# Patient Record
Sex: Male | Born: 2000 | Race: Black or African American | Hispanic: No | Marital: Single | Smoking: Current every day smoker
Health system: Southern US, Community
[De-identification: ages and names within clinical notes are randomized; demographics above are authoritative.]

## PROBLEM LIST (undated history)

## (undated) DIAGNOSIS — Z9289 Personal history of other medical treatment: Secondary | ICD-10-CM

## (undated) DIAGNOSIS — L0591 Pilonidal cyst without abscess: Secondary | ICD-10-CM

## (undated) HISTORY — DX: Pilonidal cyst without abscess: L05.91

---
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
## 2019-03-13 IMAGING — CR DG ABDOMEN 1V
1 series · 1 of 1 positions shown · non-contrast
Comparison: None.

CLINICAL DATA: 15-year-old male with generalized abdominal pain.

EXAM:
ABDOMEN - 1 VIEW

[dg abd 1 view]
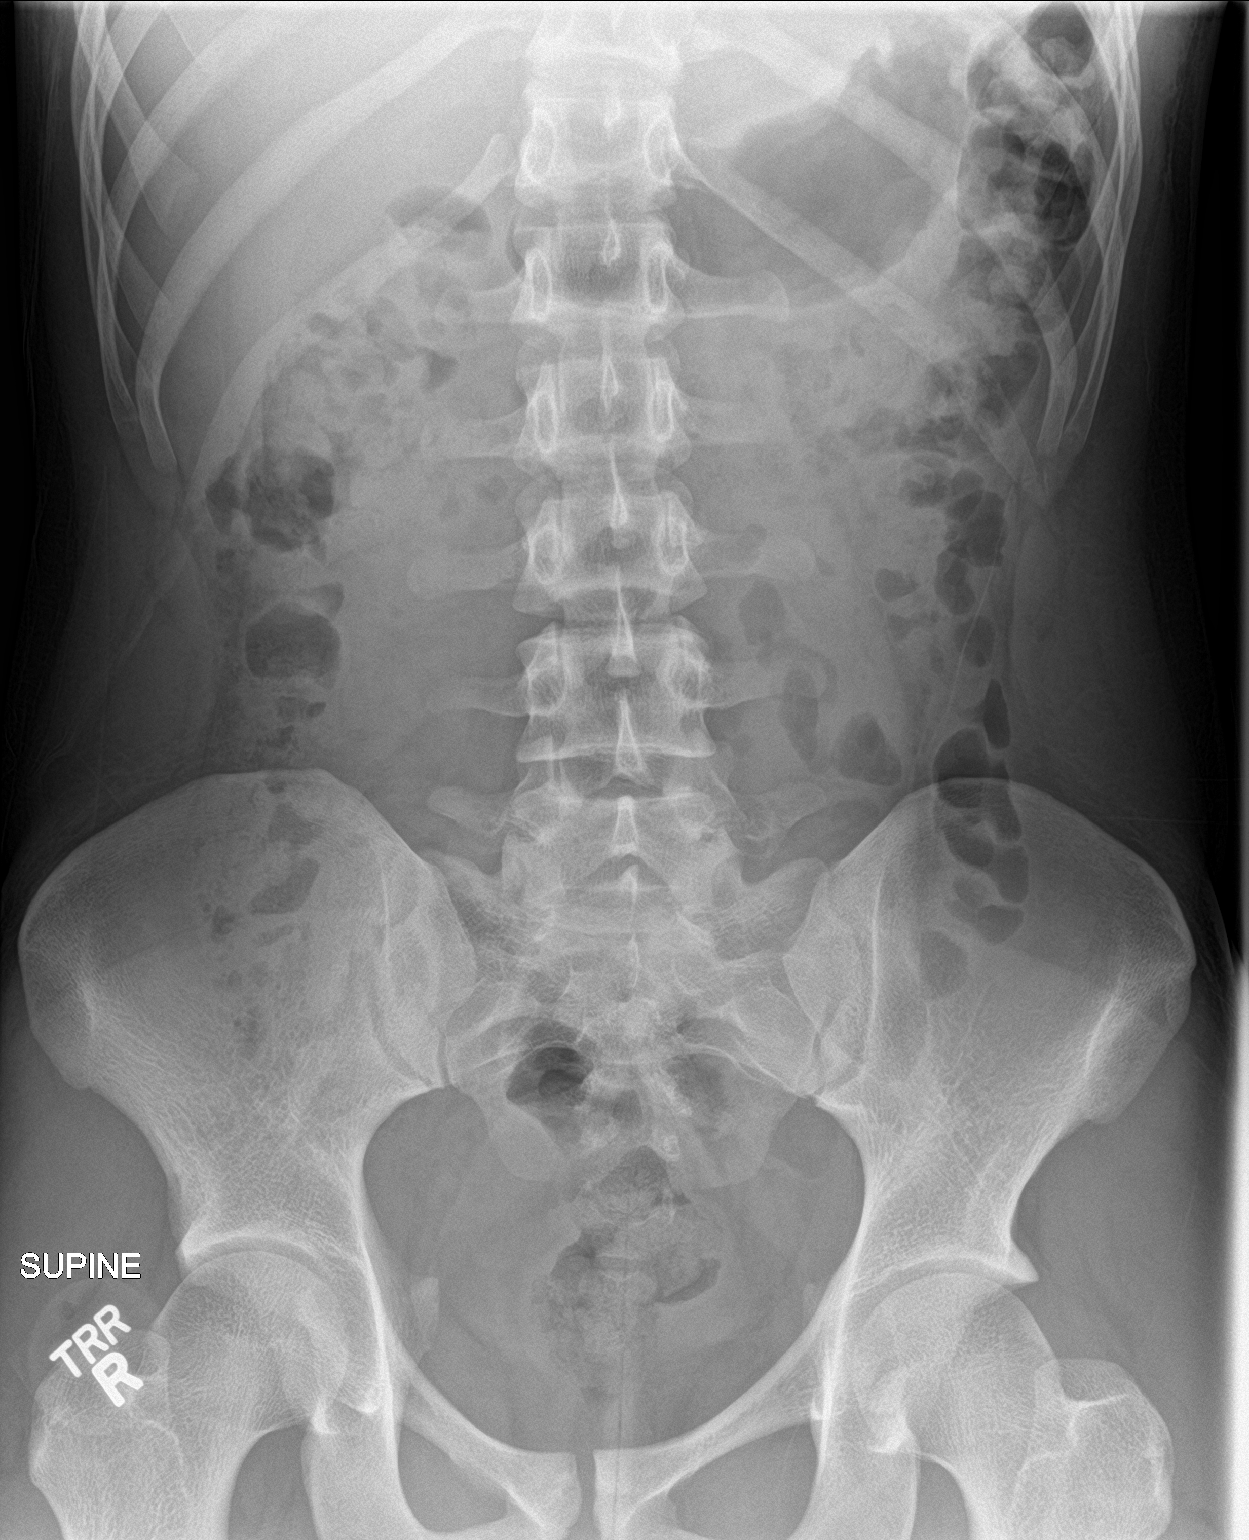

[1 of 1 positions shown; findings below may reference images not displayed]

FINDINGS: The bowel gas pattern is normal. No radio-opaque calculi or other
significant radiographic abnormality are seen.
IMPRESSION: Negative.

## 2019-08-27 IMAGING — DX DG KNEE COMPLETE 4+V*R*
4 series · 4 of 4 positions shown · non-contrast
Comparison: December 21, 2013

CLINICAL DATA: Anterior right knee pain for 3 days.

EXAM:
RIGHT KNEE - COMPLETE 4+ VIEW

[knee ap (1 of 3)]
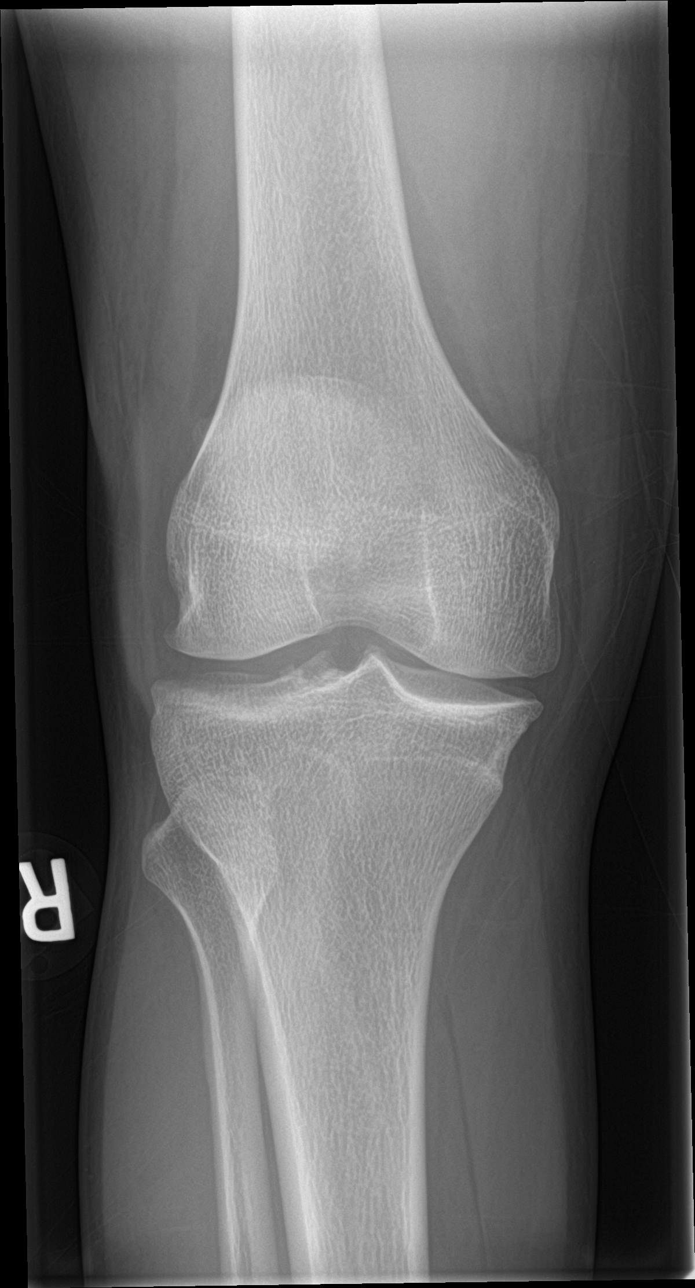

[knee lat]
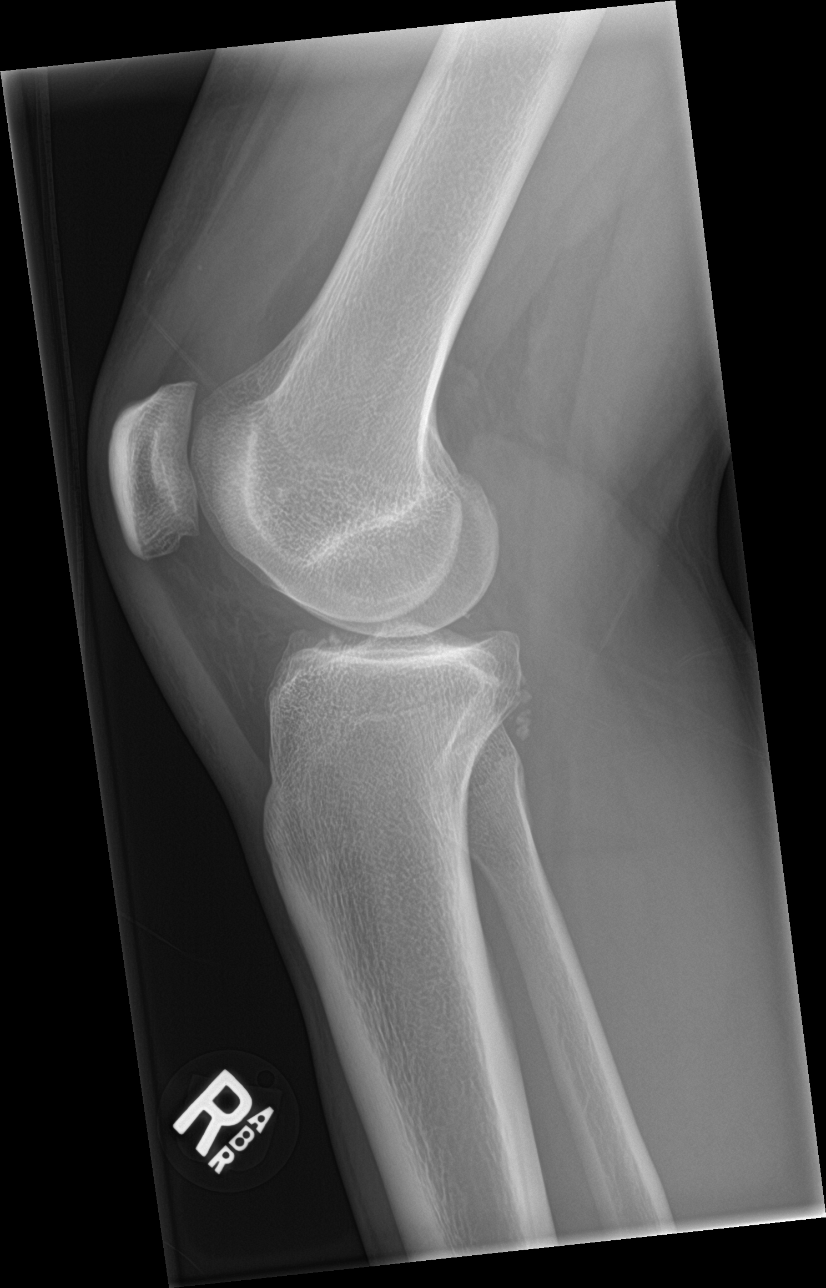

[knee ap (2 of 3)]
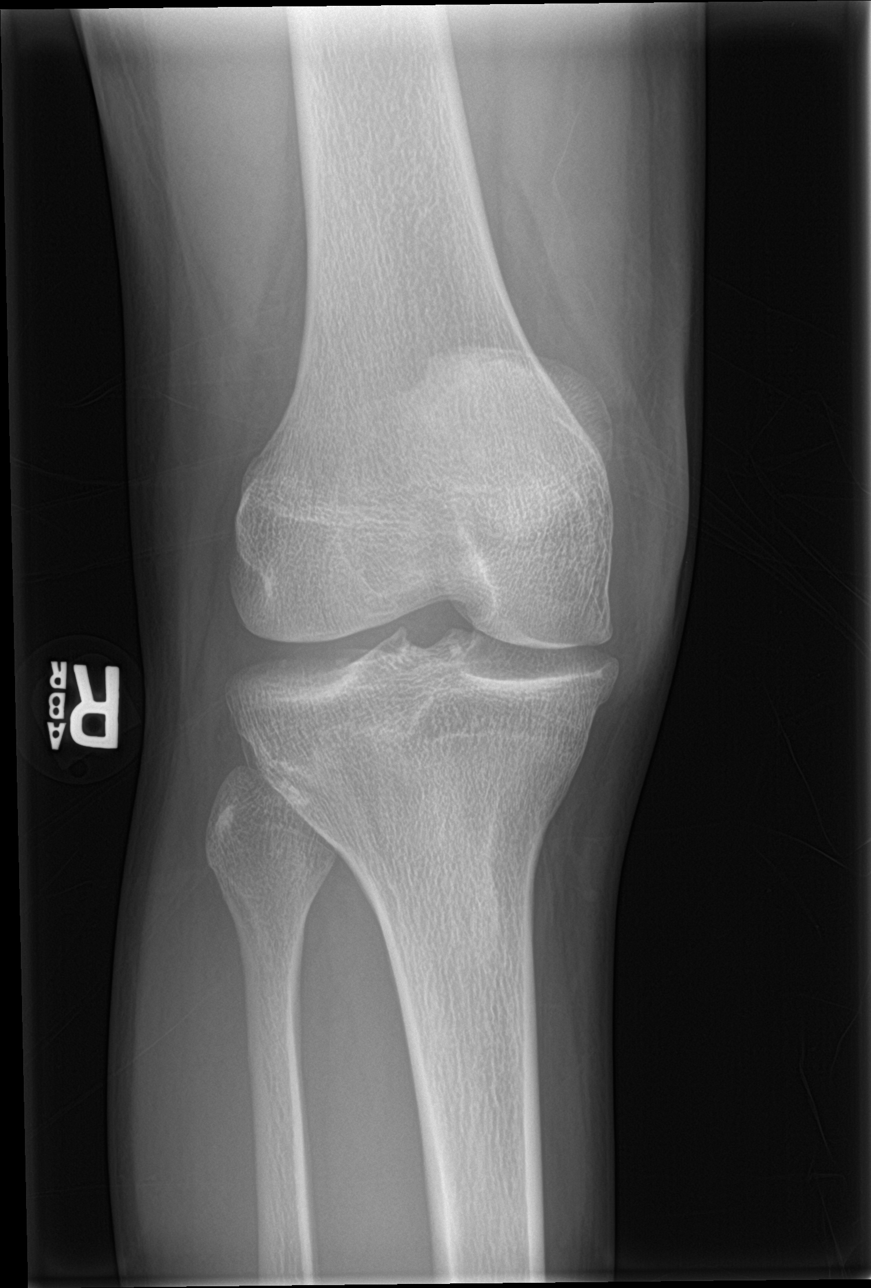

[knee ap (3 of 3)]
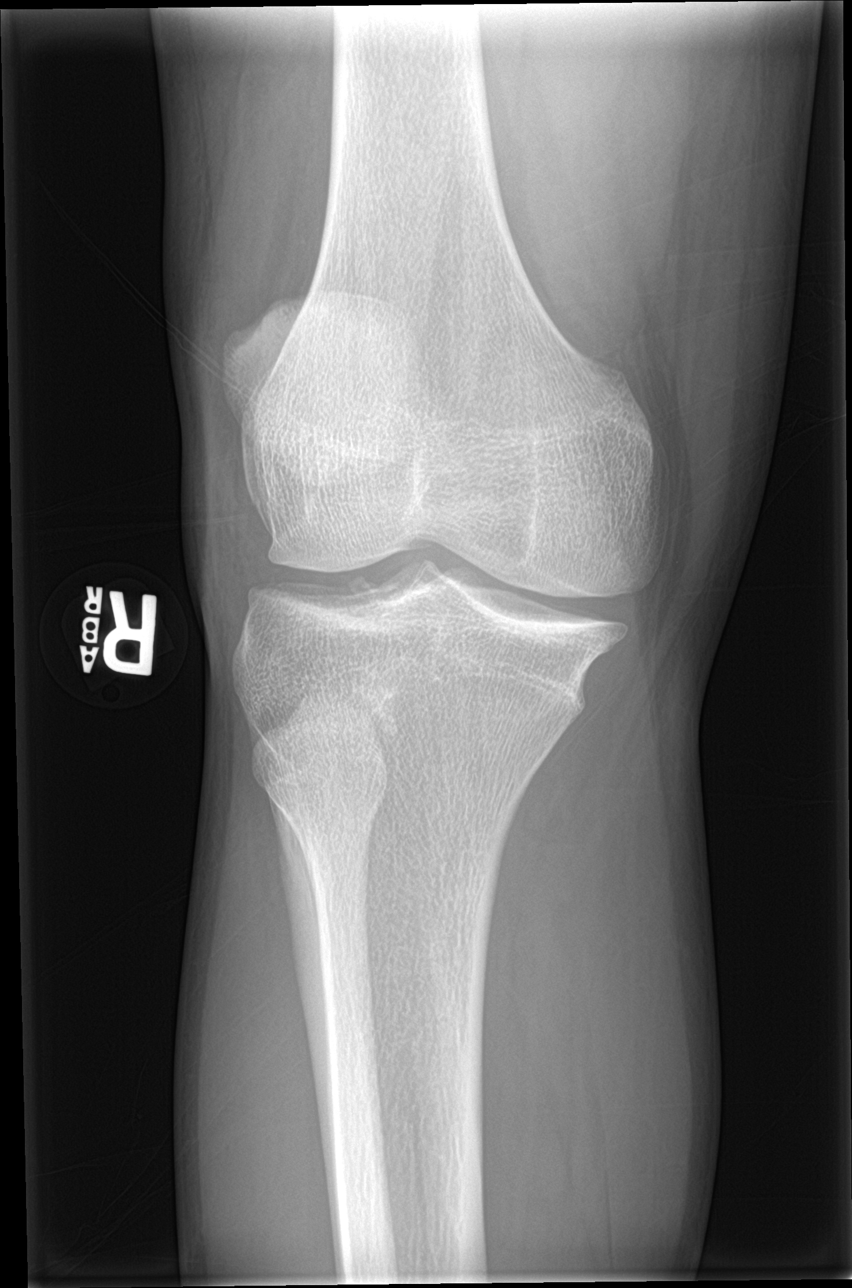

[4 of 4 positions shown; findings below may reference images not displayed]

FINDINGS: No evidence of fracture, dislocation, or joint effusion.
Degenerative joint changes of the right knee are noted. Soft tissues
are unremarkable.
IMPRESSION: No acute abnormality.  Degenerative joint changes of right knee.

## 2019-10-19 ENCOUNTER — Emergency Department (HOSPITAL_COMMUNITY): Payer: No Typology Code available for payment source

## 2019-10-19 ENCOUNTER — Emergency Department (HOSPITAL_COMMUNITY)
Admission: EM | Admit: 2019-10-19 | Discharge: 2019-10-19 | Disposition: A | Discharge status: Home / Self Care | Payer: No Typology Code available for payment source | Attending: Emergency Medicine | Admitting: Emergency Medicine

## 2019-10-19 ENCOUNTER — Other Ambulatory Visit: Payer: Self-pay

## 2019-10-19 ENCOUNTER — Encounter (HOSPITAL_COMMUNITY): Payer: Self-pay | Admitting: Emergency Medicine

## 2019-10-19 DIAGNOSIS — F17290 Nicotine dependence, other tobacco product, uncomplicated: Secondary | ICD-10-CM | POA: Insufficient documentation

## 2019-10-19 DIAGNOSIS — Y999 Unspecified external cause status: Secondary | ICD-10-CM | POA: Diagnosis not present

## 2019-10-19 DIAGNOSIS — Y92410 Unspecified street and highway as the place of occurrence of the external cause: Secondary | ICD-10-CM | POA: Diagnosis not present

## 2019-10-19 DIAGNOSIS — S199XXA Unspecified injury of neck, initial encounter: Secondary | ICD-10-CM | POA: Diagnosis present

## 2019-10-19 DIAGNOSIS — Y939 Activity, unspecified: Secondary | ICD-10-CM | POA: Diagnosis not present

## 2019-10-19 DIAGNOSIS — S161XXA Strain of muscle, fascia and tendon at neck level, initial encounter: Secondary | ICD-10-CM | POA: Diagnosis not present

## 2019-10-19 MED ORDER — METHOCARBAMOL 500 MG PO TABS: 500.0000 mg | ORAL_TABLET | Freq: Two times a day (BID) | ORAL | 0 refills | Status: AC

## 2019-10-19 MED ORDER — METHOCARBAMOL 500 MG PO TABS: 500.0000 mg | ORAL_TABLET | Freq: Two times a day (BID) | ORAL | 0 refills | Status: DC

## 2019-10-19 NOTE — ED Notes (Signed)
Discharge instructions discussed with pt. Pt verbalized understanding with no question at this time. Pt to go  home with mother.
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
## 2019-11-21 IMAGING — DX DG CHEST 2V
2 series · 2 of 2 positions shown · non-contrast
Comparison: Chest x-ray dated June 24, 2017.

CLINICAL DATA: Left-sided chest pain for the past few months.

EXAM:
CHEST  2 VIEW

[chest pa]
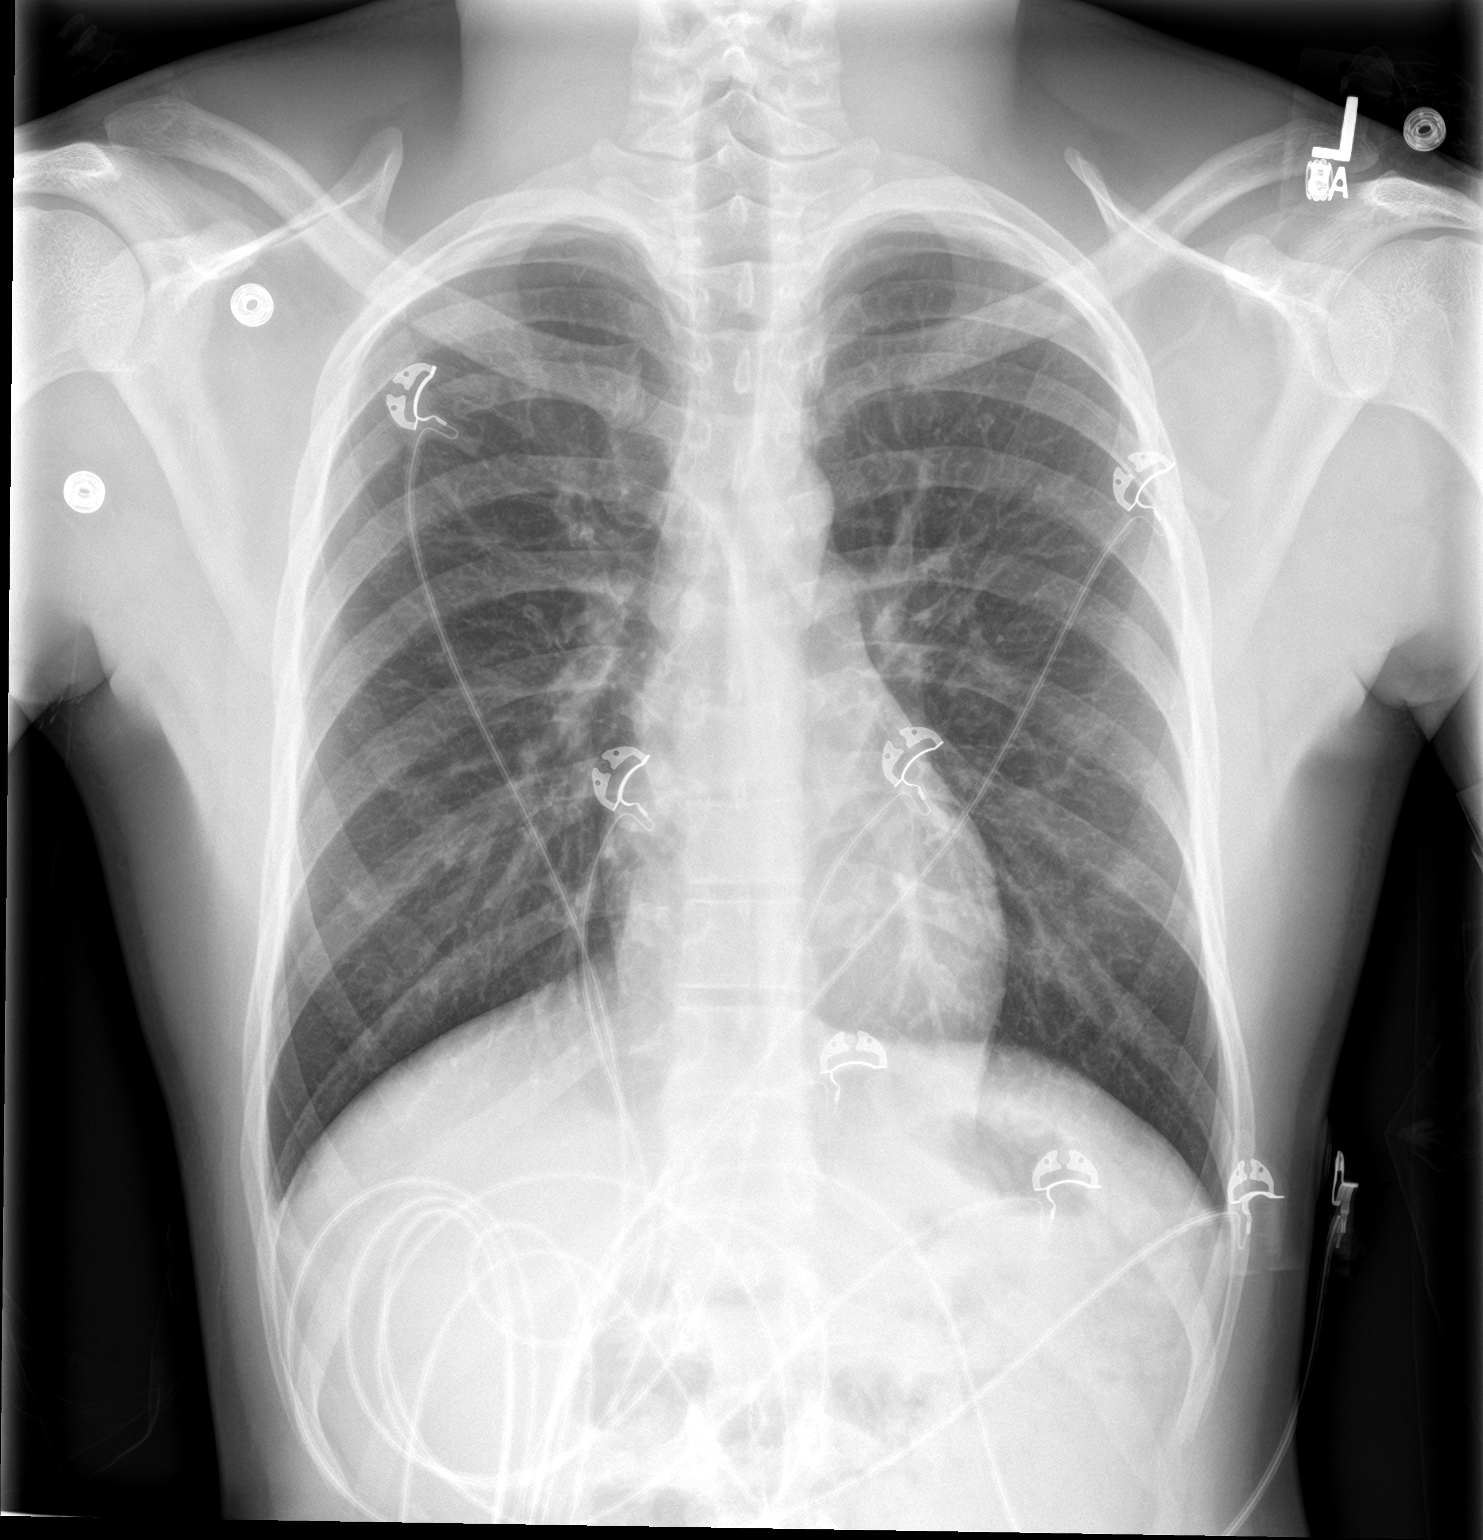

[chest lat]
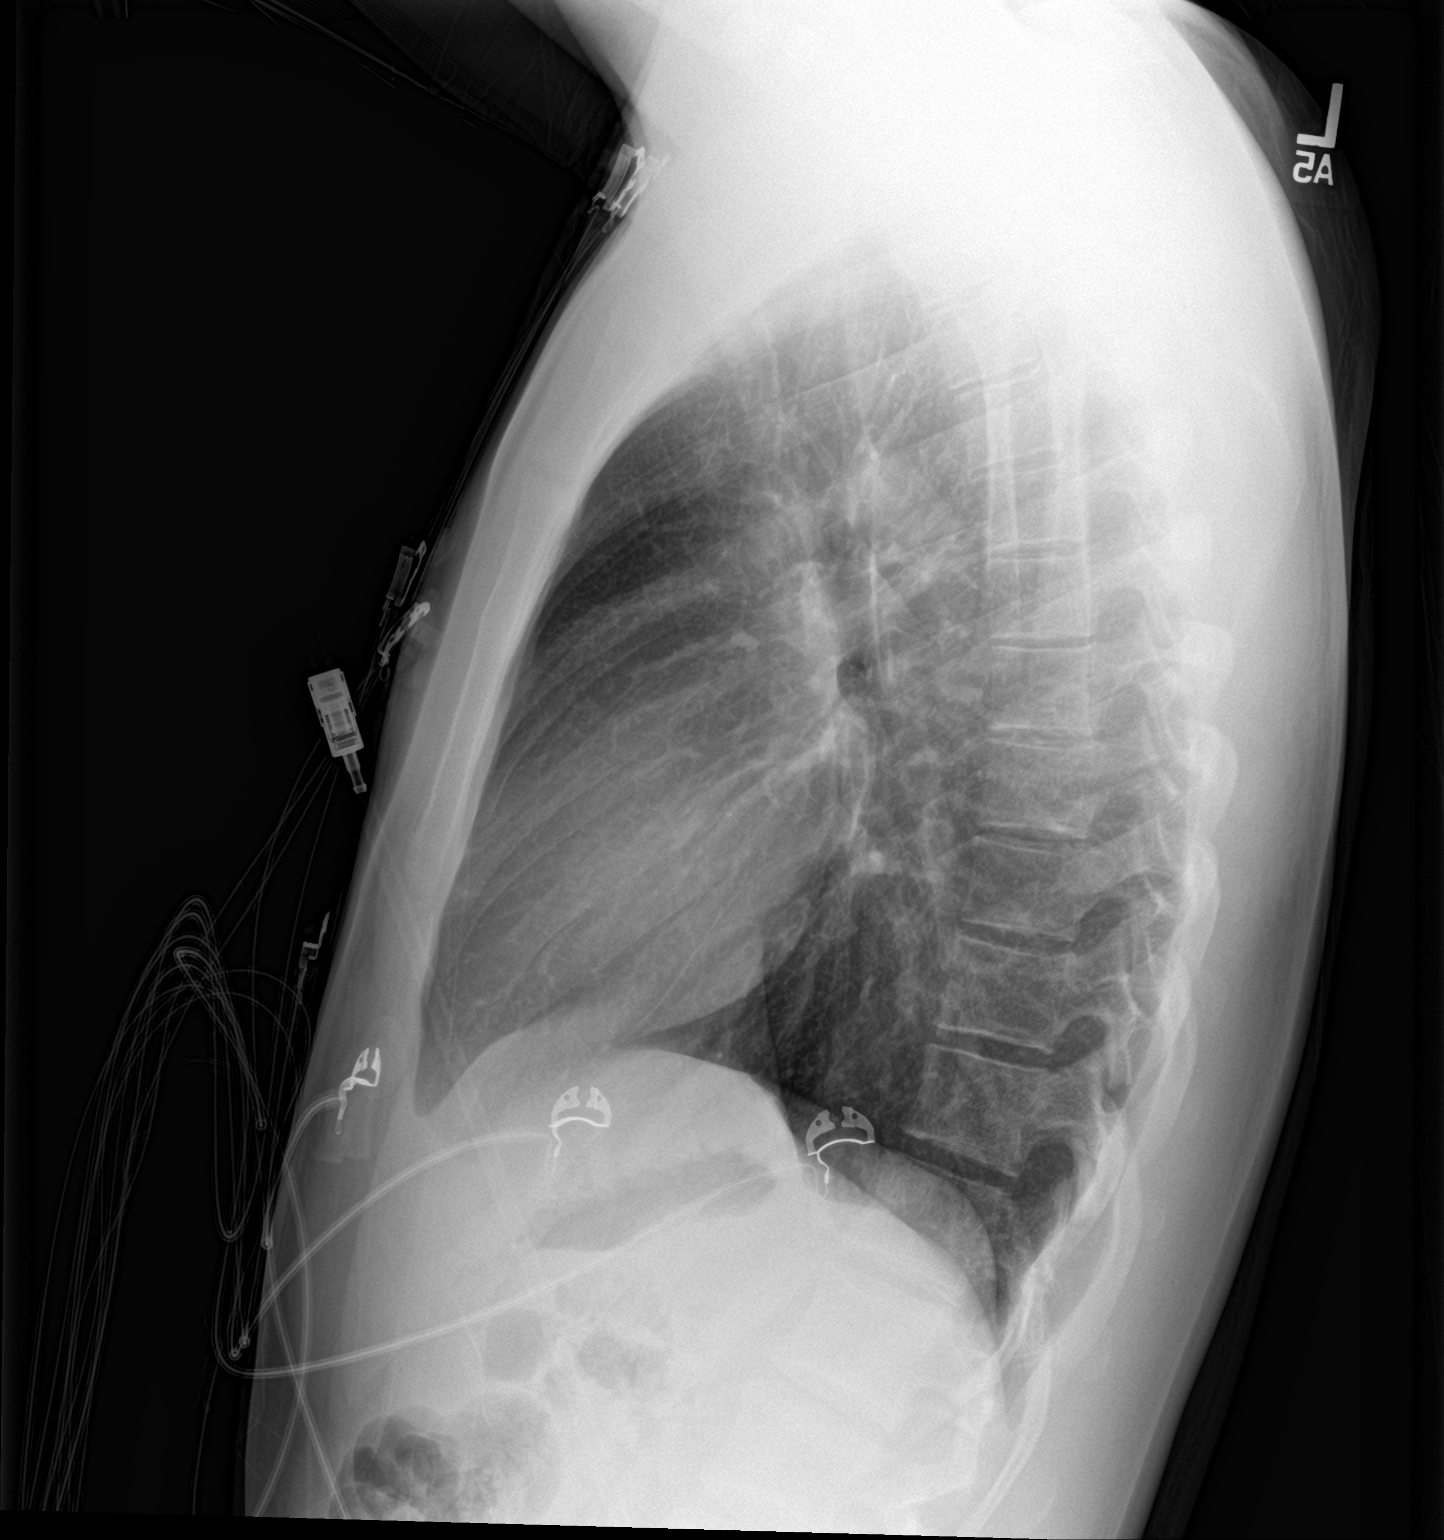

[2 of 2 positions shown; findings below may reference images not displayed]

FINDINGS: The heart size and mediastinal contours are within normal limits.
Both lungs are clear. The visualized skeletal structures are
unremarkable.
IMPRESSION: No active cardiopulmonary disease.

## 2022-09-18 ENCOUNTER — Ambulatory Visit: Payer: Medicaid Other
